# Patient Record
Sex: Female | Born: 1975
Health system: Southern US, Community
[De-identification: ages and names within clinical notes are randomized; demographics above are authoritative.]

## PROBLEM LIST (undated history)

## (undated) ENCOUNTER — Emergency Department (HOSPITAL_COMMUNITY): Discharge status: Absent without leave | Payer: Self-pay

## (undated) ENCOUNTER — Emergency Department (HOSPITAL_COMMUNITY): Admission: EM | Discharge status: Absent without leave | Payer: Self-pay

## (undated) DIAGNOSIS — T7840XA Allergy, unspecified, initial encounter: Secondary | ICD-10-CM

## (undated) DIAGNOSIS — E079 Disorder of thyroid, unspecified: Secondary | ICD-10-CM

## (undated) DIAGNOSIS — F419 Anxiety disorder, unspecified: Secondary | ICD-10-CM

## (undated) DIAGNOSIS — E559 Vitamin D deficiency, unspecified: Secondary | ICD-10-CM

## (undated) DIAGNOSIS — Z8616 Personal history of COVID-19: Secondary | ICD-10-CM

## (undated) DIAGNOSIS — G43909 Migraine, unspecified, not intractable, without status migrainosus: Secondary | ICD-10-CM

## (undated) DIAGNOSIS — N959 Unspecified menopausal and perimenopausal disorder: Secondary | ICD-10-CM

## (undated) DIAGNOSIS — E039 Hypothyroidism, unspecified: Secondary | ICD-10-CM

## (undated) HISTORY — DX: Allergy, unspecified, initial encounter: T78.40XA

## (undated) HISTORY — DX: Hypothyroidism, unspecified: E03.9

## (undated) HISTORY — DX: Disorder of thyroid, unspecified: E07.9

## (undated) HISTORY — DX: Migraine, unspecified, not intractable, without status migrainosus: G43.909

## (undated) HISTORY — DX: Personal history of COVID-19: Z86.16

## (undated) HISTORY — DX: Unspecified menopausal and perimenopausal disorder: N95.9

## (undated) HISTORY — DX: Anxiety disorder, unspecified: F41.9

## (undated) HISTORY — DX: Vitamin D deficiency, unspecified: E55.9

---

## 1998-11-27 ENCOUNTER — Inpatient Hospital Stay (HOSPITAL_COMMUNITY): Admission: AD | Admit: 1998-11-27 | Discharge: 1998-11-27 | Payer: Self-pay | Admitting: *Deleted

## 1999-01-31 ENCOUNTER — Inpatient Hospital Stay (HOSPITAL_COMMUNITY): Admission: AD | Admit: 1999-01-31 | Discharge: 1999-02-02 | Payer: Self-pay | Admitting: Obstetrics & Gynecology

## 1999-03-18 ENCOUNTER — Other Ambulatory Visit: Admission: RE | Admit: 1999-03-18 | Discharge: 1999-03-18 | Payer: Self-pay | Admitting: *Deleted

## 2000-04-15 ENCOUNTER — Other Ambulatory Visit: Admission: RE | Admit: 2000-04-15 | Discharge: 2000-04-15 | Payer: Self-pay | Admitting: *Deleted

## 2001-04-19 ENCOUNTER — Other Ambulatory Visit: Admission: RE | Admit: 2001-04-19 | Discharge: 2001-04-19 | Payer: Self-pay | Admitting: *Deleted

## 2002-01-03 ENCOUNTER — Inpatient Hospital Stay (HOSPITAL_COMMUNITY): Admission: AD | Admit: 2002-01-03 | Discharge: 2002-01-05 | Payer: Self-pay | Admitting: Obstetrics and Gynecology

## 2002-01-03 ENCOUNTER — Encounter (INDEPENDENT_AMBULATORY_CARE_PROVIDER_SITE_OTHER): Payer: Self-pay

## 2002-02-08 ENCOUNTER — Other Ambulatory Visit: Admission: RE | Admit: 2002-02-08 | Discharge: 2002-02-08 | Payer: Self-pay | Admitting: Obstetrics and Gynecology

## 2003-02-12 ENCOUNTER — Other Ambulatory Visit: Admission: RE | Admit: 2003-02-12 | Discharge: 2003-02-12 | Payer: Self-pay | Admitting: *Deleted

## 2003-02-26 ENCOUNTER — Encounter: Payer: Self-pay | Admitting: *Deleted

## 2003-02-26 ENCOUNTER — Encounter: Admission: RE | Admit: 2003-02-26 | Discharge: 2003-02-26 | Payer: Self-pay | Admitting: *Deleted

## 2004-07-27 ENCOUNTER — Emergency Department (HOSPITAL_COMMUNITY): Admission: EM | Admit: 2004-07-27 | Discharge: 2004-07-27 | Payer: Self-pay | Admitting: Emergency Medicine

## 2008-09-26 ENCOUNTER — Encounter: Admission: RE | Admit: 2008-09-26 | Discharge: 2008-09-26 | Payer: Self-pay | Admitting: Endocrinology

## 2011-06-26 ENCOUNTER — Other Ambulatory Visit: Payer: Self-pay | Admitting: Dermatology

## 2013-12-22 ENCOUNTER — Encounter: Payer: Self-pay | Admitting: Podiatrist

## 2013-12-22 ENCOUNTER — Ambulatory Visit (INDEPENDENT_AMBULATORY_CARE_PROVIDER_SITE_OTHER): Payer: 59 | Admitting: Podiatrist

## 2013-12-22 ENCOUNTER — Other Ambulatory Visit: Payer: Self-pay | Admitting: *Deleted

## 2013-12-22 VITALS — BP 119/78 | HR 88 | Resp 18

## 2013-12-22 DIAGNOSIS — B351 Tinea unguium: Secondary | ICD-10-CM

## 2013-12-22 NOTE — Progress Notes (Signed)
   Subjective:    Patient ID: Michele Dennis, female    DOB: 02-01-1976, 38 y.o.   MRN: [id number]  HPI my toenails are discolored and has been going on for about 5 years and pain in big toe on left and brittle and crumble sometimes    Review of Systems  Constitutional: Negative.   HENT: Negative.   Eyes: Negative.   Respiratory: Negative.   Cardiovascular: Negative.   Gastrointestinal: Negative.   Endocrine: Negative.   Genitourinary: Negative.   Musculoskeletal: Negative.   Skin: Negative.   Allergic/Immunologic: Negative.   Neurological: Negative.   Hematological: Negative.   Psychiatric/Behavioral: Negative.        Objective:   Physical Exam  GENERAL APPEARANCE: Alert, conversant. Appropriately groomed. No acute distress.  VASCULAR: Pedal pulses palpable and strong bilateral.  Capillary refill time is immediate to all digits,  Proximal to distal cooling it warm to warm.  Digital hair growth is present bilateral  NEUROLOGIC: sensation is intact epicritically and protectively to 5.07 monofilament at 5/5 sites bilateral.  Light touch is intact bilateral, vibratory sensation intact bilateral, achilles tendon reflex is intact bilateral.  MUSCULOSKELETAL: acceptable muscle strength, tone and stability bilateral.  Intrinsic muscluature intact bilateral.  Rectus appearance of foot and digits noted bilateral.   DERMATOLOGIC: Yellowish brownish discoloration present to bilateral great toenails. Subungual debris minimal, mild dystrophic changes are seen.    Assessment & Plan:  Onychomycosis versus dystrophy bilateral great toenails  Plan: Discussed oral, topical, and laser therapy for onychomycosis. At today's visit a culture was taken of the nail and we will wait for the results before deciding on treatment options. We did discuss oral therapy as a first line approach.

## 2013-12-22 NOTE — Patient Instructions (Signed)
We will call with the results of the culture to determine the best treatment option for your nails.  Onychomycosis/Fungal Toenails  WHAT IS IT? An infection that lies within the keratin of your nail plate that is caused by a fungus.  WHY ME? Fungal infections affect all ages, sexes, races, and creeds.  There may be many factors that predispose you to a fungal infection such as age, coexisting medical conditions such as diabetes, or an autoimmune disease; stress, medications, fatigue, genetics, etc.  Bottom line: fungus thrives in a warm, moist environment and your shoes offer such a location.  IS IT CONTAGIOUS? Theoretically, yes.  You do not want to share shoes, nail clippers or files with someone who has fungal toenails.  Walking around barefoot in the same room or sleeping in the same bed is unlikely to transfer the organism.  It is important to realize, however, that fungus can spread easily from one nail to the next on the same foot.  HOW DO WE TREAT THIS?  There are several ways to treat this condition.  Treatment may depend on many factors such as age, medications, pregnancy, liver and kidney conditions, etc.  It is best to ask your doctor which options are available to you.  1. No treatment.   Unlike many other medical concerns, you can live with this condition.  However for many people this can be a painful condition and may lead to ingrown toenails or a bacterial infection.  It is recommended that you keep the nails cut short to help reduce the amount of fungal nail. 2. Topical treatment.  These range from herbal remedies to prescription strength nail lacquers.  About 40-50% effective, topicals require twice daily application for approximately 9 to 12 months or until an entirely new nail has grown out.  The most effective topicals are medical grade medications available through physicians offices. 3. Oral antifungal medications.  With an 80-90% cure rate, the most common oral medication  requires 3 to 4 months of therapy and stays in your system for a year as the new nail grows out.  Oral antifungal medications do require blood work to make sure it is a safe drug for you.  A liver function panel will be performed prior to starting the medication and after the first month of treatment.  It is important to have the blood work performed to avoid any harmful side effects.  In general, this medication safe but blood work is required. 4. Laser Therapy.  This treatment is performed by applying a specialized laser to the affected nail plate.  This therapy is noninvasive, fast, and non-painful.  It is not covered by insurance and is therefore, out of pocket.  [facility] is the only practice in the area to offer this therapy.

## 2013-12-22 NOTE — Progress Notes (Deleted)
Bilateral great toenails-- possile finger..... Culture--  Will decide on treatment

## 2013-12-25 ENCOUNTER — Telehealth: Payer: Self-pay | Admitting: *Deleted

## 2013-12-25 NOTE — Telephone Encounter (Signed)
B/L 1st [phone], for determination of fungal elements.

## 2014-01-09 ENCOUNTER — Telehealth: Payer: Self-pay | Admitting: *Deleted

## 2014-01-09 NOTE — Telephone Encounter (Signed)
Pt asked for results of fungal cultures of 12/22/2013.  I informed pt the results would be back in 4 - 6 weeks.

## 2014-01-25 ENCOUNTER — Telehealth: Payer: Self-pay | Admitting: *Deleted

## 2014-01-25 MED ORDER — NUVAIL EX SOLN: 1.0000 [drp] | Freq: Every day | CUTANEOUS | Status: DC

## 2014-01-25 NOTE — Telephone Encounter (Signed)
Dr Irving ShowsEgerton states initial fungal results are negative, recommends NuVail Topical.  Orders to pt and she would like the prescription.  Sent electronically.

## 2014-02-02 ENCOUNTER — Encounter: Payer: Self-pay | Admitting: Podiatrist

## 2014-02-09 ENCOUNTER — Telehealth: Payer: Self-pay | Admitting: *Deleted

## 2014-02-09 NOTE — Telephone Encounter (Signed)
Dr Irving ShowsEgerton states pt's final culture result was negative.  I informed the pt and reminded her of the NuVail that was initially called in 01/09/2014.

## 2016-06-09 ENCOUNTER — Emergency Department (HOSPITAL_BASED_OUTPATIENT_CLINIC_OR_DEPARTMENT_OTHER)
Admission: EM | Admit: 2016-06-09 | Discharge: 2016-06-09 | Disposition: A | Discharge status: Home / Self Care | Payer: Self-pay | Attending: Emergency Medicine | Admitting: Emergency Medicine

## 2016-06-09 ENCOUNTER — Encounter (HOSPITAL_BASED_OUTPATIENT_CLINIC_OR_DEPARTMENT_OTHER): Payer: Self-pay

## 2016-06-09 DIAGNOSIS — L03031 Cellulitis of right toe: Secondary | ICD-10-CM

## 2016-06-09 DIAGNOSIS — L03115 Cellulitis of right lower limb: Secondary | ICD-10-CM | POA: Insufficient documentation

## 2016-06-09 MED ORDER — CEPHALEXIN 250 MG PO CAPS
500.0000 mg | ORAL_CAPSULE | Freq: Once | ORAL | Status: AC
  Administered 2016-06-09: 500 mg via ORAL

## 2016-06-09 MED ORDER — CEPHALEXIN 250 MG PO CAPS
ORAL_CAPSULE | ORAL | Status: AC
  Filled 2016-06-09: qty 2

## 2016-06-09 MED ORDER — CEPHALEXIN 500 MG PO CAPS: 500.0000 mg | ORAL_CAPSULE | Freq: Four times a day (QID) | ORAL | Status: DC

## 2016-06-09 NOTE — ED Notes (Signed)
MD at bedside. 

## 2016-06-09 NOTE — Discharge Instructions (Signed)
You were seen today for likely very early infection of your foot because of a recent wart removal. You are well appearing otherwise. Sometimes you can get in skin infection called Cellulitis. If you develop fever, worsening pain or redness you need to be reevaluated.  Cellulitis Cellulitis is an infection of the skin and the tissue beneath it. The infected area is usually red and tender. Cellulitis occurs most often in the arms and lower legs.  CAUSES  Cellulitis is caused by bacteria that enter the skin through cracks or cuts in the skin. The most common types of bacteria that cause cellulitis are staphylococci and streptococci. SIGNS AND SYMPTOMS   Redness and warmth.  Swelling.  Tenderness or pain.  Fever. DIAGNOSIS  Your health care provider can usually determine what is wrong based on a physical exam. Blood tests may also be done. TREATMENT  Treatment usually involves taking an antibiotic medicine. HOME CARE INSTRUCTIONS   Take your antibiotic medicine as directed by your health care provider. Finish the antibiotic even if you start to feel better.  Keep the infected arm or leg elevated to reduce swelling.  Apply a warm cloth to the affected area up to 4 times per day to relieve pain.  Take medicines only as directed by your health care provider.  Keep all follow-up visits as directed by your health care provider. SEEK MEDICAL CARE IF:   You notice red streaks coming from the infected area.  Your red area gets larger or turns dark in color.  Your bone or joint underneath the infected area becomes painful after the skin has healed.  Your infection returns in the same area or another area.  You notice a swollen bump in the infected area.  You develop new symptoms.  You have a fever. SEEK IMMEDIATE MEDICAL CARE IF:   You feel very sleepy.  You develop vomiting or diarrhea.  You have a general ill feeling (malaise) with muscle aches and pains.   This information  is not intended to replace advice given to you by your health care provider. Make sure you discuss any questions you have with your health care provider.   Document Released: 09/09/2005 Document Revised: 08/21/2015 Document Reviewed: 02/15/2012 Elsevier Interactive Patient Education [employer]2016 Elsevier Inc.

## 2016-06-09 NOTE — ED Provider Notes (Signed)
CSN: [id number]     Arrival date & time 06/09/16  2144 History  By signing my name below, I, [facility] Dennis, attest that this documentation has been prepared under the direction and in the presence of Shon Batonourtney F Emmelia Holdsworth, MD. Electronically Signed: Randell PatientMarrissa Dennis, ED Scribe. 06/09/2016. 11:26 PM.   Chief Complaint  Patient presents with  . Foot Pain    Patient is a 40 y.o. female presenting with lower extremity pain. The history is provided by the patient. No language interpreter was used.  Foot Pain   HPI Comments: Michele Dennis is a 40 y.o. female with no pertinent chronic conditions who presents to the Emergency Department complaining of constant, 5/10 right foot pain onset earlier this afternoon. Pt states that she was seen by her dermatologist earlier today to have a plantar wart removed where the MD caused her to bleed followed by red streaking in her foot this afternoon. Pain is worse with weight bearing and ambulation. She is allergic to sulfa antibiotics. Denies fevers or any other symptoms currently.  Past Medical History  Diagnosis Date  . Allergy   . Thyroid disease    History reviewed. No pertinent past surgical history. No family history on file. Social History  Substance Use Topics  . Smoking status: Never Smoker   . Smokeless tobacco: Never Used  . Alcohol Use: No   OB History    No data available     Review of Systems  Constitutional: Negative for fever.  Musculoskeletal: Positive for myalgias (right foot).  Skin: Positive for color change (erythema).  All other systems reviewed and are negative.     Allergies  Sulfa antibiotics  Home Medications   Prior to Admission medications   Medication Sig Start Date End Date Taking? Authorizing Provider  cephALEXin (KEFLEX) 500 MG capsule Take 1 capsule (500 mg total) by mouth 4 (four) times daily. 06/09/16   Shon Batonourtney F Lashaunda Schild, MD  loratadine-pseudoephedrine (CLARITIN-D 12-HOUR) 5-120 MG per  tablet Take 1 tablet by mouth 2 (two) times daily.    Historical Provider, MD  Multiple Vitamin (MULTIVITAMIN) tablet Take 1 tablet by mouth daily.    Historical Provider, MD  SYNTHROID 88 MCG tablet  12/19/13   Historical Provider, MD   BP 125/97 mmHg  Pulse 81  Temp(Src) 98.5 F (36.9 C) (Oral)  Resp 16  Ht 5\' 7"  (1.702 m)  Wt 130 lb (58.968 kg)  BMI 20.36 kg/m2  SpO2 100%  LMP 05/26/2016 Physical Exam  Constitutional: She is oriented to person, place, and time. She appears well-developed and well-nourished.  HENT:  Head: Normocephalic and atraumatic.  Cardiovascular: Normal rate, regular rhythm and normal heart sounds.   Pulmonary/Chest: Effort normal and breath sounds normal. No respiratory distress.  Neurological: She is alert and oriented to person, place, and time.  Skin: Skin is warm and dry.  Were removed over the plantar aspect of the foot just proximal to the fourth and fifth digits, no significant erythema around the wart removal site; however there is mild streaking over the dorsum of the foot that into the midfoot, no crepitus, tender to palpation  Psychiatric: She has a normal mood and affect.  Nursing note and vitals reviewed.   ED Course  Procedures   DIAGNOSTIC STUDIES: Oxygen Saturation is 100% on RA, normal by my interpretation.    COORDINATION OF CARE: 11:22 PM Will order and prescribe Keflex. Discussed treatment plan with pt at bedside and pt agreed to plan.   MDM  Final diagnoses:  Cellulitis of toe of right foot    Patient presents with concerns for infection from recent wart removal site. She is nontoxic. Afebrile. The site itself looks clean dry and intact without significant erythema but she does have some streaking up her foot. There is also a small abrasion at the wart removal site. Will place patient on Keflex for 5 days. Follow-up with dermatology.  After history, exam, and medical workup I feel the patient has been appropriately medically  screened and is safe for discharge home. Pertinent diagnoses were discussed with the patient. Patient was given return precautions.  I personally performed the services described in this documentation, which was scribed in my presence. The recorded information has been reviewed and is accurate.   Shon Batonourtney F Raelie Lohr, MD 06/10/16 [phone]

## 2016-06-09 NOTE — ED Notes (Signed)
Pt states she had wart removal from bottom of right foot at dermo today-now having increase pain, swelling red streaks to foot

## 2017-02-25 DIAGNOSIS — E039 Hypothyroidism, unspecified: Secondary | ICD-10-CM | POA: Diagnosis not present

## 2017-03-19 DIAGNOSIS — E039 Hypothyroidism, unspecified: Secondary | ICD-10-CM | POA: Diagnosis not present

## 2017-04-30 DIAGNOSIS — D225 Melanocytic nevi of trunk: Secondary | ICD-10-CM | POA: Diagnosis not present

## 2017-04-30 DIAGNOSIS — L700 Acne vulgaris: Secondary | ICD-10-CM | POA: Diagnosis not present

## 2017-04-30 DIAGNOSIS — L821 Other seborrheic keratosis: Secondary | ICD-10-CM | POA: Diagnosis not present

## 2017-09-16 DIAGNOSIS — R300 Dysuria: Secondary | ICD-10-CM | POA: Diagnosis not present

## 2017-10-01 DIAGNOSIS — N390 Urinary tract infection, site not specified: Secondary | ICD-10-CM | POA: Diagnosis not present

## 2017-10-01 DIAGNOSIS — R300 Dysuria: Secondary | ICD-10-CM | POA: Diagnosis not present

## 2017-10-25 DIAGNOSIS — Z1151 Encounter for screening for human papillomavirus (HPV): Secondary | ICD-10-CM | POA: Diagnosis not present

## 2017-10-25 DIAGNOSIS — Z1231 Encounter for screening mammogram for malignant neoplasm of breast: Secondary | ICD-10-CM | POA: Diagnosis not present

## 2017-10-25 DIAGNOSIS — Z01419 Encounter for gynecological examination (general) (routine) without abnormal findings: Secondary | ICD-10-CM | POA: Diagnosis not present

## 2017-10-25 DIAGNOSIS — Z6821 Body mass index (BMI) 21.0-21.9, adult: Secondary | ICD-10-CM | POA: Diagnosis not present

## 2018-02-05 ENCOUNTER — Emergency Department (HOSPITAL_COMMUNITY): Payer: BLUE CROSS/BLUE SHIELD

## 2018-02-05 ENCOUNTER — Other Ambulatory Visit: Payer: Self-pay

## 2018-02-05 ENCOUNTER — Emergency Department (HOSPITAL_COMMUNITY)
Admission: EM | Admit: 2018-02-05 | Discharge: 2018-02-05 | Disposition: A | Discharge status: Home / Self Care | Payer: BLUE CROSS/BLUE SHIELD | Attending: Emergency Medicine | Admitting: Emergency Medicine

## 2018-02-05 ENCOUNTER — Encounter (HOSPITAL_COMMUNITY): Payer: Self-pay

## 2018-02-05 DIAGNOSIS — W01198A Fall on same level from slipping, tripping and stumbling with subsequent striking against other object, initial encounter: Secondary | ICD-10-CM | POA: Insufficient documentation

## 2018-02-05 DIAGNOSIS — S0181XA Laceration without foreign body of other part of head, initial encounter: Secondary | ICD-10-CM | POA: Diagnosis not present

## 2018-02-05 DIAGNOSIS — R1012 Left upper quadrant pain: Secondary | ICD-10-CM | POA: Diagnosis not present

## 2018-02-05 DIAGNOSIS — R51 Headache: Secondary | ICD-10-CM | POA: Diagnosis not present

## 2018-02-05 DIAGNOSIS — Y9239 Other specified sports and athletic area as the place of occurrence of the external cause: Secondary | ICD-10-CM | POA: Insufficient documentation

## 2018-02-05 DIAGNOSIS — Y998 Other external cause status: Secondary | ICD-10-CM | POA: Insufficient documentation

## 2018-02-05 DIAGNOSIS — S3991XA Unspecified injury of abdomen, initial encounter: Secondary | ICD-10-CM | POA: Diagnosis not present

## 2018-02-05 DIAGNOSIS — R55 Syncope and collapse: Secondary | ICD-10-CM | POA: Diagnosis not present

## 2018-02-05 DIAGNOSIS — S2242XA Multiple fractures of ribs, left side, initial encounter for closed fracture: Secondary | ICD-10-CM | POA: Insufficient documentation

## 2018-02-05 DIAGNOSIS — R079 Chest pain, unspecified: Secondary | ICD-10-CM | POA: Diagnosis not present

## 2018-02-05 DIAGNOSIS — S0990XA Unspecified injury of head, initial encounter: Secondary | ICD-10-CM | POA: Diagnosis not present

## 2018-02-05 DIAGNOSIS — Y9343 Activity, gymnastics: Secondary | ICD-10-CM | POA: Diagnosis not present

## 2018-02-05 DIAGNOSIS — Z79899 Other long term (current) drug therapy: Secondary | ICD-10-CM | POA: Insufficient documentation

## 2018-02-05 DIAGNOSIS — S2232XA Fracture of one rib, left side, initial encounter for closed fracture: Secondary | ICD-10-CM | POA: Diagnosis not present

## 2018-02-05 DIAGNOSIS — W010XXA Fall on same level from slipping, tripping and stumbling without subsequent striking against object, initial encounter: Secondary | ICD-10-CM

## 2018-02-05 LAB — COMPREHENSIVE METABOLIC PANEL
ALK PHOS: 59 U/L (ref 38–126)
ALT: 28 U/L (ref 14–54)
ANION GAP: 12 (ref 5–15)
AST: 34 U/L (ref 15–41)
Albumin: 3.5 g/dL (ref 3.5–5.0)
BILIRUBIN TOTAL: 1 mg/dL (ref 0.3–1.2)
BUN: 14 mg/dL (ref 6–20)
CO2: 17 mmol/L — AB (ref 22–32)
CREATININE: 1.04 mg/dL — AB (ref 0.44–1.00)
Calcium: 8.7 mg/dL — ABNORMAL LOW (ref 8.9–10.3)
Chloride: 105 mmol/L (ref 101–111)
GFR calc non Af Amer: >60 mL/min (ref >60)
Glucose, Bld: 111 mg/dL — ABNORMAL HIGH (ref 65–99)
Potassium: 3.5 mmol/L (ref 3.5–5.1)
Sodium: 134 mmol/L — ABNORMAL LOW (ref 135–145)
Total Protein: 5.7 g/dL — ABNORMAL LOW (ref 6.5–8.1)

## 2018-02-05 LAB — CBC
HEMATOCRIT: 39.9 % (ref 36.0–46.0)
Hemoglobin: 13.5 g/dL (ref 12.0–15.0)
MCH: 36 pg — AB (ref 26.0–34.0)
MCHC: 33.8 g/dL (ref 30.0–36.0)
MCV: 106.4 fL — ABNORMAL HIGH (ref 78.0–100.0)
PLATELETS: 299 10*3/uL (ref 150–400)
RBC: 3.75 MIL/uL — ABNORMAL LOW (ref 3.87–5.11)
RDW: 11.8 % (ref 11.5–15.5)
WBC: 7.3 10*3/uL (ref 4.0–10.5)

## 2018-02-05 LAB — SAMPLE TO BLOOD BANK

## 2018-02-05 LAB — I-STAT BETA HCG BLOOD, ED (MC, WL, AP ONLY)

## 2018-02-05 MED ORDER — HYDROMORPHONE HCL 1 MG/ML IJ SOLN
0.5000 mg | Freq: Once | INTRAMUSCULAR | Status: DC
  Filled 2018-02-05: qty 1

## 2018-02-05 MED ORDER — ONDANSETRON HCL 4 MG/2ML IJ SOLN: 4.0000 mg | Freq: Once | INTRAMUSCULAR | Status: DC

## 2018-02-05 MED ORDER — LIDOCAINE-EPINEPHRINE (PF) 2 %-1:200000 IJ SOLN
20.0000 mL | Freq: Once | INTRAMUSCULAR | Status: AC
  Administered 2018-02-05: 20 mL
  Filled 2018-02-05: qty 20

## 2018-02-05 MED ORDER — TETANUS-DIPHTH-ACELL PERTUSSIS 5-2.5-18.5 LF-MCG/0.5 IM SUSP
0.5000 mL | Freq: Once | INTRAMUSCULAR | Status: DC
  Filled 2018-02-05: qty 0.5

## 2018-02-05 MED ORDER — ONDANSETRON 4 MG PO TBDP
4.0000 mg | ORAL_TABLET | Freq: Once | ORAL | Status: AC
  Administered 2018-02-05: 4 mg via ORAL
  Filled 2018-02-05: qty 1

## 2018-02-05 MED ORDER — ONDANSETRON 8 MG PO TBDP: 8.0000 mg | ORAL_TABLET | Freq: Three times a day (TID) | ORAL | 0 refills | Status: DC | PRN

## 2018-02-05 MED ORDER — HYDROCODONE-ACETAMINOPHEN 5-325 MG PO TABS: 1.0000 | ORAL_TABLET | Freq: Four times a day (QID) | ORAL | 0 refills | Status: DC | PRN

## 2018-02-05 MED ORDER — IOPAMIDOL (ISOVUE-300) INJECTION 61%
INTRAVENOUS | Status: AC
  Administered 2018-02-05: 100 mL
  Filled 2018-02-05: qty 100

## 2018-02-05 NOTE — ED Provider Notes (Signed)
[facility] EMERGENCY DEPARTMENT Provider Note   CSN: [id number] Arrival date & time: 02/05/18  1008     History   Chief Complaint Chief Complaint  Patient presents with  . Loss of Consciousness    HPI Michele Dennis is a 42 y.o. female.  Patient arrives via EMS s/p fall and syncopal event at gym. Had tripped over low hurdle, hit head and left lower ribs/upper abdomen. Was having pain in those areas.  Sat down to recover, when had syncopal event, hitting head, and sustaining forehead laceration. +loc. Pt states has history of 'passing out easily'. Denies palpitations or dysrhythmia. Dull headache. No neck pain. No chest pain other than left lower ribs where hit chest. No sob. Left upper abd pain. No vomiting. Denies back pain or extremity pain or injury.    The history is provided by the patient and the EMS personnel.  Loss of Consciousness   Associated symptoms include chest pain. Pertinent negatives include abdominal pain, back pain, confusion, fever, palpitations, vomiting and weakness.    Past Medical History:  Diagnosis Date  . Allergy   . Thyroid disease     There are no active problems to display for this patient.   History reviewed. No pertinent surgical history.  OB History    No data available       Home Medications    Prior to Admission medications   Medication Sig Start Date End Date Taking? Authorizing Provider  cephALEXin (KEFLEX) 500 MG capsule Take 1 capsule (500 mg total) by mouth 4 (four) times daily. 06/09/16   Horton, Mayer Masker, MD  loratadine-pseudoephedrine (CLARITIN-D 12-HOUR) 5-120 MG per tablet Take 1 tablet by mouth 2 (two) times daily.    [provider]  Multiple Vitamin (MULTIVITAMIN) tablet Take 1 tablet by mouth daily.    [provider]  SYNTHROID 25 MCG tablet  12/19/13   [provider]    Family History History reviewed. No pertinent family history.  Social History Social  History   Tobacco Use  . Smoking status: Never Smoker  . Smokeless tobacco: Never Used  Substance Use Topics  . Alcohol use: No  . Drug use: No     Allergies   Sulfa antibiotics   Review of Systems Review of Systems  Constitutional: Negative for fever.  HENT: Negative for nosebleeds.   Eyes: Negative for visual disturbance.  Respiratory: Negative for shortness of breath.   Cardiovascular: Positive for chest pain and syncope. Negative for palpitations.  Gastrointestinal: Negative for abdominal pain and vomiting.  Genitourinary: Negative for flank pain.  Musculoskeletal: Negative for back pain and neck pain.  Skin: Positive for wound.  Neurological: Negative for weakness and numbness.  Hematological: Does not bruise/bleed easily.  Psychiatric/Behavioral: Negative for confusion.     Physical Exam Updated Vital Signs BP 90/60 (BP Location: Right Arm)   Pulse 63   Ht 1.676 m (5\' 6" )   Wt 57.6 kg (127 lb)   LMP 01/22/2018 (Within Weeks)   SpO2 100%   BMI 20.50 kg/m   Physical Exam  Constitutional: She is oriented to person, place, and time. She appears well-developed and well-nourished. No distress.  HENT:  3 cm right forehead laceration.   Eyes: Conjunctivae and EOM are normal. Pupils are equal, round, and reactive to light. No scleral icterus.  Neck: Normal range of motion. Neck supple. No tracheal deviation present.  Cardiovascular: Normal rate, regular rhythm, normal heart sounds and intact distal pulses. Exam reveals no  gallop and no friction rub.  No murmur heard. Pulmonary/Chest: Effort normal and breath sounds normal. No respiratory distress. She exhibits tenderness.  Left lower chest wall tenderness. Normal chest wall movement. No crepitus.   Abdominal: Soft. Normal appearance. She exhibits no distension. There is tenderness.  Left upper abd tenderness.   Genitourinary:  Genitourinary Comments: No cva tenderness  Musculoskeletal: She exhibits no edema.    Neurological: She is alert and oriented to person, place, and time.  Speech clear/fluent. Motor intact bil, moves bil extremities purposefully w good strength.   Skin: Skin is warm and dry. No rash noted. She is not diaphoretic.  Psychiatric: She has a normal mood and affect.  Nursing note and vitals reviewed.    ED Treatments / Results  Labs (all labs ordered are listed, but only abnormal results are displayed) Results for orders placed or performed during the hospital encounter of 02/05/18  Comprehensive metabolic panel  Result Value Ref Range   Sodium 134 (L) 135 - 145 mmol/L   Potassium 3.5 3.5 - 5.1 mmol/L   Chloride 105 101 - 111 mmol/L   CO2 17 (L) 22 - 32 mmol/L   Glucose, Bld 111 (H) 65 - 99 mg/dL   BUN 14 6 - 20 mg/dL   Creatinine, Ser 1.61 (H) 0.44 - 1.00 mg/dL   Calcium 8.7 (L) 8.9 - 10.3 mg/dL   Total Protein 5.7 (L) 6.5 - 8.1 g/dL   Albumin 3.5 3.5 - 5.0 g/dL   AST 34 15 - 41 U/L   ALT 28 14 - 54 U/L   Alkaline Phosphatase 59 38 - 126 U/L   Total Bilirubin 1.0 0.3 - 1.2 mg/dL   GFR calc non Af Amer >60 >60 mL/min   GFR calc Af Amer >60 >60 mL/min   Anion gap 12 5 - 15  CBC  Result Value Ref Range   WBC 7.3 4.0 - 10.5 K/uL   RBC 3.75 (L) 3.87 - 5.11 MIL/uL   Hemoglobin 13.5 12.0 - 15.0 g/dL   HCT 09.6 04.5 - 40.9 %   MCV 106.4 (H) 78.0 - 100.0 fL   MCH 36.0 (H) 26.0 - 34.0 pg   MCHC 33.8 30.0 - 36.0 g/dL   RDW 81.1 91.4 - 78.2 %   Platelets 299 150 - 400 K/uL  I-Stat Beta hCG blood, ED ([facility], [facility], [facility] only)  Result Value Ref Range   I-stat hCG, quantitative <5.0 <5 mIU/mL   Comment 3          Sample to Blood Bank  Result Value Ref Range   Blood Bank Specimen SAMPLE AVAILABLE FOR TESTING    Sample Expiration      02/06/2018 Performed at [facility], [address] [place], [place] [address]     EKG  EKG Interpretation None       Radiology Dg Ribs Unilateral W/chest Left  Result Date: 02/05/2018 CLINICAL DATA:  Left-sided chest pain  after fall. EXAM: LEFT RIBS AND CHEST - 3+ VIEW COMPARISON:  None. FINDINGS: Minimally displaced fractures of the left anterior ninth and tenth ribs. There is no evidence of pneumothorax or pleural effusion. Both lungs are clear. Heart size and mediastinal contours are within normal limits. IMPRESSION: 1. Minimally displaced fractures of the left anterior ninth and tenth ribs. 2.  No active cardiopulmonary disease. Electronically Signed   By: Obie Dredge M.D.   On: 02/05/2018 11:03   Ct Head Wo Contrast  Result Date: 02/05/2018 CLINICAL DATA:  Pain after trauma EXAM: CT HEAD WITHOUT CONTRAST TECHNIQUE: Contiguous axial images were obtained from the base of the skull through the vertex without intravenous contrast. COMPARISON:  None. FINDINGS: Brain: No evidence of acute infarction, hemorrhage, hydrocephalus, extra-axial collection or mass lesion/mass effect. Vascular: No hyperdense vessel or unexpected calcification. Skull: Normal. Negative for fracture or focal lesion. Sinuses/Orbits: No acute finding. Other: None. IMPRESSION: Normal study. Electronically Signed   By: Gerome Samavid  Williams III M.D   On: 02/05/2018 13:04   Ct Abdomen Pelvis W Contrast  Result Date: 02/05/2018 CLINICAL DATA:  pt fell on side them stood up and passed out and hit her head on a desk now has a 2in lac to side of head EXAM: CT ABDOMEN AND PELVIS WITH CONTRAST TECHNIQUE: Multidetector CT imaging of the abdomen and pelvis was performed using the standard protocol following bolus administration of intravenous contrast. CONTRAST:  100mL ISOVUE-300 IOPAMIDOL (ISOVUE-300) INJECTION 61% COMPARISON:  None. FINDINGS: Lower chest: Clear lung bases.  Heart normal in size. Hepatobiliary: No focal liver abnormality is seen. No gallstones, gallbladder wall thickening, or biliary dilatation. Pancreas: Unremarkable. No pancreatic ductal dilatation or surrounding inflammatory changes. Spleen: Normal in size without focal abnormality.  Adrenals/Urinary Tract: No adrenal masses. Left kidney is ptotic and under rotated, but normal in size. Right kidneys normal size, orientation and position. No renal masses or stones. Normal renal enhancement and excretion. No hydronephrosis. Ureters are normal in caliber. Bladder is unremarkable. Stomach/Bowel: Stomach and small bowel unremarkable. Mild to moderate increased stool in the colon. No colonic distention, wall thickening or evidence of inflammation. Appendix not discretely seen. No evidence of appendicitis. Vascular/Lymphatic: No significant vascular findings are present. No enlarged abdominal or pelvic lymph nodes. Reproductive: Uterus is normal in overall size. There is heterogeneous enhancement of the mid to upper uterine segment and fundus. The uterus has a septate morphology. The heterogeneous enhancement may be from poorly defined fibroids. No adnexal masses. Other: No abdominal wall hernia or abnormality. No abdominopelvic ascites. Musculoskeletal: No fracture or acute finding. No osteoblastic or osteolytic lesions. IMPRESSION: 1. No acute abnormalities within the abdomen or pelvis. 2. Abnormal appearing heterogeneous enhancement of the uterus. This is of unclear etiology. Consider further evaluation with transabdominal and endovaginal pelvic ultrasound. Uterus has a septate morphology on CT. 3. Mild to moderate increased stool in the colon. Electronically Signed   By: Amie Portlandavid  Ormond M.D.   On: 02/05/2018 13:08    Procedures Procedures (including critical care time)  Medications Ordered in ED Medications - No data to display   Initial Impression / Assessment and Plan / ED Course  I have reviewed the triage vital signs and the nursing notes.  Pertinent labs & imaging results that were available during my care of the patient were reviewed by me and considered in my medical decision making (see chart for details).  Iv ns.   Stat labs and imaging.  Pt indicates bp normally runs now.    Recheck pt breathing comfortably. Pulse ox 100%.  Dilaudid .5 mg iv for pain. zofran for nausea.  Reviewed cxr - discussed results w pt.   abd ct pending.  Abd ct neg for acute trauma.   Labs reviewed - k normal.   Recheck abd soft, no mid to lower abd or pelvic tenderness.  Tetanus unknown. Tetanus im.     Final Clinical Impressions(s) / ED Diagnoses   Final diagnoses:  None    ED Discharge Orders    None  Cathren Laine, MD 02/05/18 [phone]

## 2018-02-05 NOTE — Progress Notes (Signed)
Called to trauma C for patient who fell in gym.  Chaplain not needed.   02/05/18 1000  Clinical Encounter Type  Visited With Other (Comment) (Paged to [facility] C)  Visit Type Trauma  Referral From Nurse  Consult/Referral To Chaplain  Phebe Collaonna S Rashidah [place], Chaplain

## 2018-02-05 NOTE — Discharge Instructions (Addendum)
It was our pleasure to provide your ER care today - we hope that you feel better.  Keep wound/laceration very clean. You may apply a thin coat of bacitracin to area for the next couple days. Have sutures removed, your doctor or urgent care, in 5-7 days.   For rib fractures, take motrin or aleve as need for pain. You may also take hydrocodone as need for pain. No driving for the next 6 hours or when taking hydrocodone. Also, do not take tylenol or acetaminophen containing medication when taking hydrocodone.  In future, if you begin to feel weak or faint as if you may pass out, lie down/flat immediately, so as to avoid passing out and possible injury.   Your CT scan was read as showing: IMPRESSION: 1. No acute abnormalities within the abdomen or pelvis. 2. Abnormal appearing heterogeneous enhancement of the uterus. This is of unclear etiology. Consider further evaluation with transabdominal and endovaginal pelvic ultrasound. Uterus has a septate morphology on CT.  As relates the reading above, follow up with your doctor/gynecologist in the next 1-2 weeks - have them review the ct scan and discuss possible pelvic ultrasound then.   Return to ER if worse, new symptoms, new or severe pain, increased trouble breathing, other concern.

## 2018-02-05 NOTE — ED Notes (Signed)
Patient transported to CT 

## 2018-02-05 NOTE — ED Notes (Addendum)
Patient and husband at bedside requesting plastic surgeon fix her laceration on her forehead. MD notified - at bedside.

## 2018-02-05 NOTE — ED Triage Notes (Signed)
Pt from [facility] post fall by [facility] pt fell on side them stood up and passed out and hit her head on a desk now has a 2in lac to side of head. Pt is A&O x4 ambulatory with EMS

## 2018-02-10 DIAGNOSIS — S2239XA Fracture of one rib, unspecified side, initial encounter for closed fracture: Secondary | ICD-10-CM | POA: Diagnosis not present

## 2018-02-10 DIAGNOSIS — S060X9A Concussion with loss of consciousness of unspecified duration, initial encounter: Secondary | ICD-10-CM | POA: Diagnosis not present

## 2018-02-10 DIAGNOSIS — E039 Hypothyroidism, unspecified: Secondary | ICD-10-CM | POA: Diagnosis not present

## 2018-02-10 DIAGNOSIS — S0181XA Laceration without foreign body of other part of head, initial encounter: Secondary | ICD-10-CM | POA: Diagnosis not present

## 2018-02-11 ENCOUNTER — Other Ambulatory Visit: Payer: Self-pay | Admitting: Obstetrics & Gynecology

## 2018-02-11 DIAGNOSIS — N926 Irregular menstruation, unspecified: Secondary | ICD-10-CM | POA: Diagnosis not present

## 2018-02-11 DIAGNOSIS — N920 Excessive and frequent menstruation with regular cycle: Secondary | ICD-10-CM | POA: Diagnosis not present

## 2018-02-25 DIAGNOSIS — N920 Excessive and frequent menstruation with regular cycle: Secondary | ICD-10-CM | POA: Diagnosis not present

## 2018-03-11 DIAGNOSIS — E039 Hypothyroidism, unspecified: Secondary | ICD-10-CM | POA: Diagnosis not present

## 2018-04-04 DIAGNOSIS — E039 Hypothyroidism, unspecified: Secondary | ICD-10-CM | POA: Diagnosis not present

## 2018-05-17 DIAGNOSIS — L700 Acne vulgaris: Secondary | ICD-10-CM | POA: Diagnosis not present

## 2018-07-14 DIAGNOSIS — R300 Dysuria: Secondary | ICD-10-CM | POA: Diagnosis not present

## 2018-07-14 DIAGNOSIS — N390 Urinary tract infection, site not specified: Secondary | ICD-10-CM | POA: Diagnosis not present

## 2018-07-21 DIAGNOSIS — N390 Urinary tract infection, site not specified: Secondary | ICD-10-CM | POA: Diagnosis not present

## 2019-04-04 DIAGNOSIS — E039 Hypothyroidism, unspecified: Secondary | ICD-10-CM | POA: Diagnosis not present

## 2019-04-10 DIAGNOSIS — E039 Hypothyroidism, unspecified: Secondary | ICD-10-CM | POA: Diagnosis not present

## 2019-07-11 DIAGNOSIS — L570 Actinic keratosis: Secondary | ICD-10-CM | POA: Diagnosis not present

## 2019-07-11 DIAGNOSIS — L821 Other seborrheic keratosis: Secondary | ICD-10-CM | POA: Diagnosis not present

## 2019-07-11 DIAGNOSIS — D235 Other benign neoplasm of skin of trunk: Secondary | ICD-10-CM | POA: Diagnosis not present

## 2019-07-20 DIAGNOSIS — Z6822 Body mass index (BMI) 22.0-22.9, adult: Secondary | ICD-10-CM | POA: Diagnosis not present

## 2019-07-20 DIAGNOSIS — Z1231 Encounter for screening mammogram for malignant neoplasm of breast: Secondary | ICD-10-CM | POA: Diagnosis not present

## 2019-07-20 DIAGNOSIS — Z01419 Encounter for gynecological examination (general) (routine) without abnormal findings: Secondary | ICD-10-CM | POA: Diagnosis not present

## 2019-09-25 DIAGNOSIS — Z0000 Encounter for general adult medical examination without abnormal findings: Secondary | ICD-10-CM | POA: Diagnosis not present

## 2019-10-20 IMAGING — CT CT HEAD W/O CM
4 series · 17 of 47 positions shown, 19 images · non-contrast
Comparison: None.

CLINICAL DATA: Pain after trauma

EXAM:
CT HEAD WITHOUT CONTRAST
TECHNIQUE: Contiguous axial images were obtained from the base of the skull
through the vertex without intravenous contrast.

[Series 3: head without · axial · non-contrast · 0.39mm/px · z∈[+1224,+1334]mm · 7 of 30 slices shown, 9 images]
[im 4/30  brain]
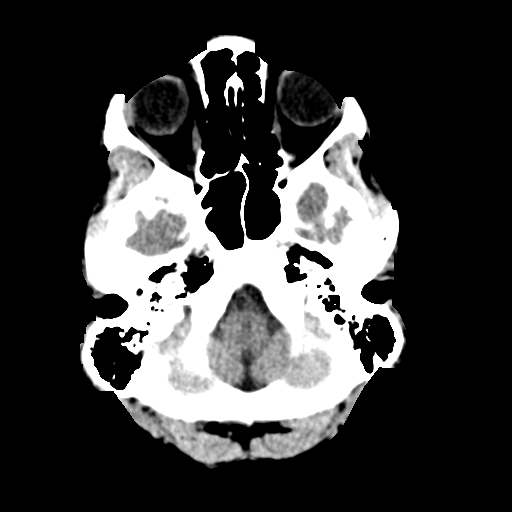
[im 4/30  bone]
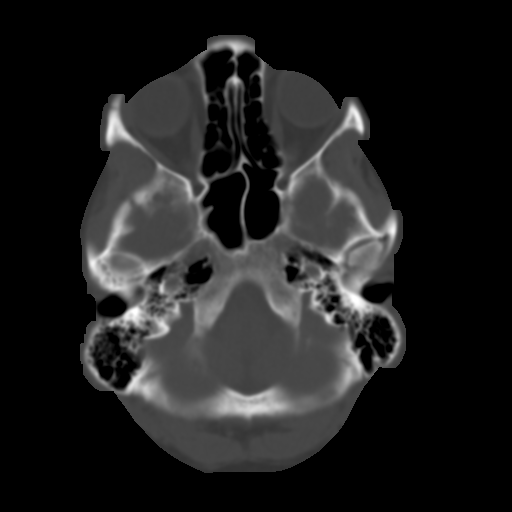
[im 8/30  brain]
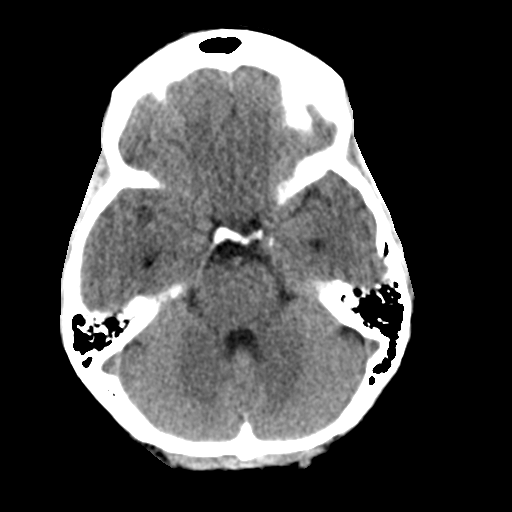
[im 11/30  brain]
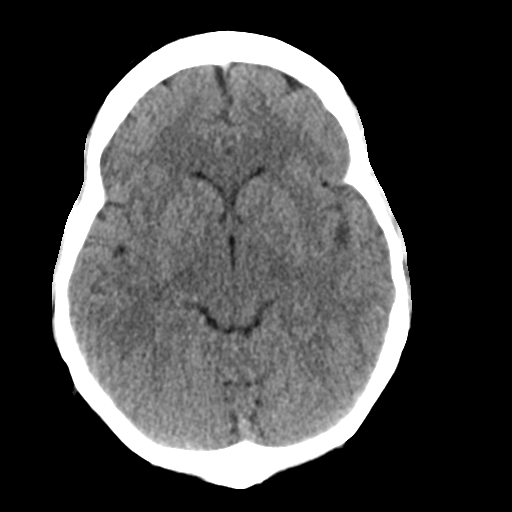
[im 15/30  brain]
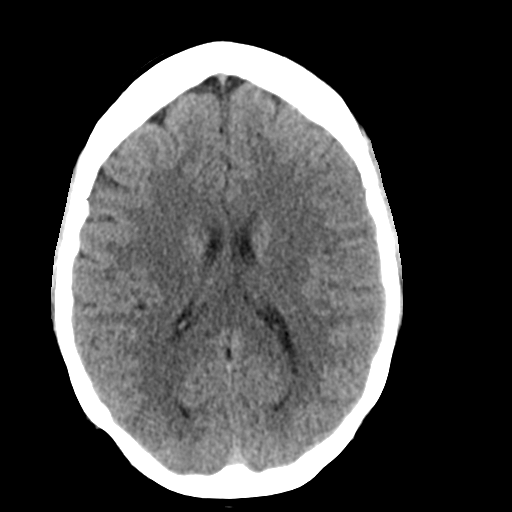
[im 19/30  brain]
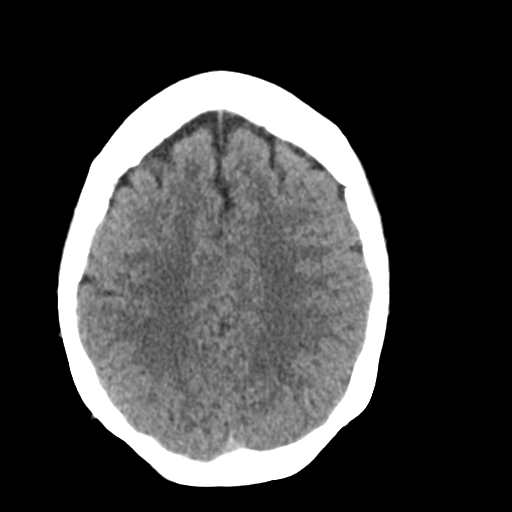
[im 19/30  bone]
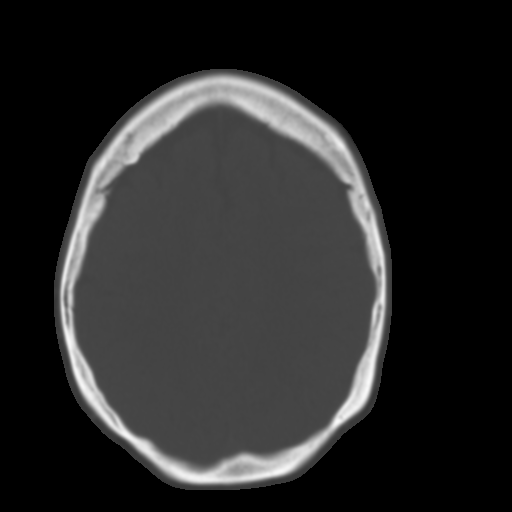
[im 22/30  brain]
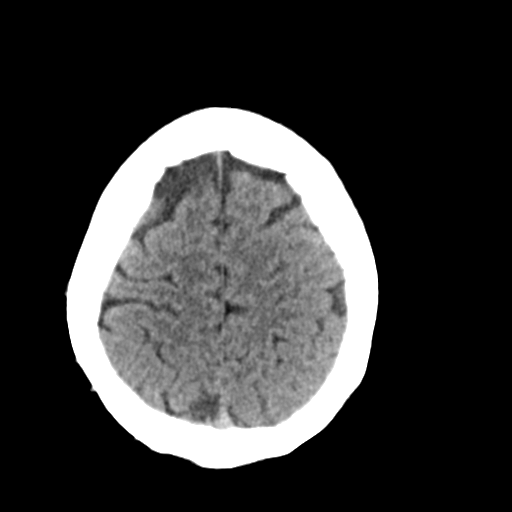
[im 26/30  brain]
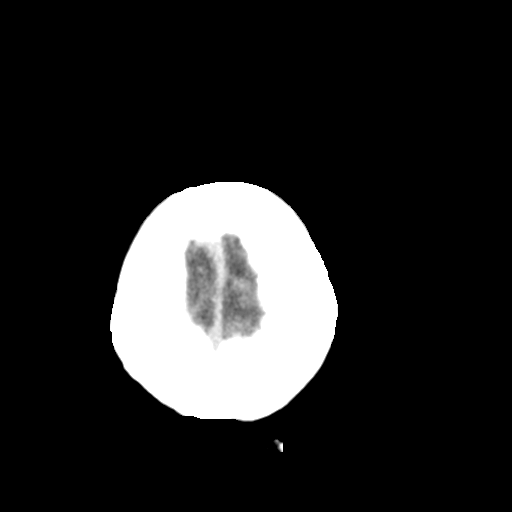

[Series 4: head bone · axial · 0.39mm/px · z∈[+1223,+1273]mm · 4 of 73 slices shown]
[im 8/73  bone]
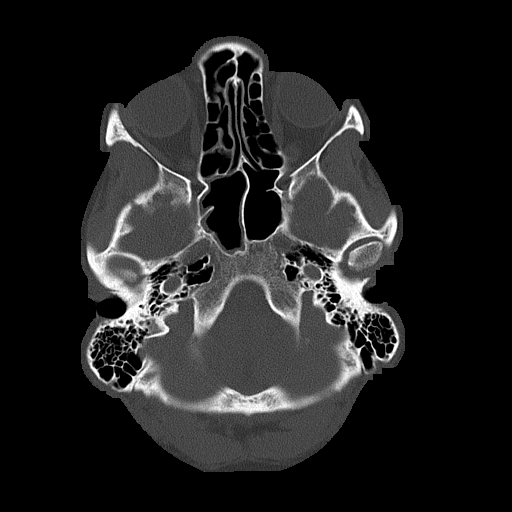
[im 15/73  bone]
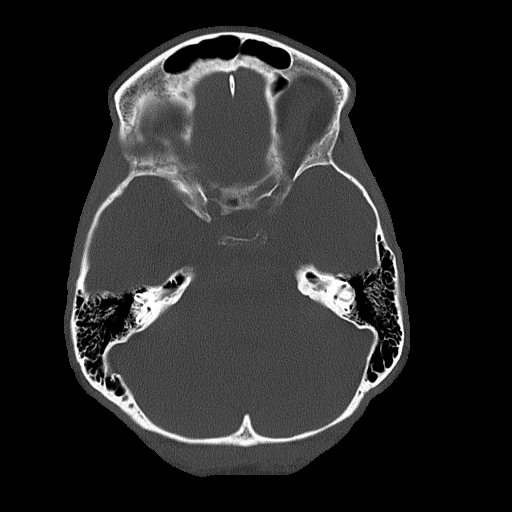
[im 22/73  bone]
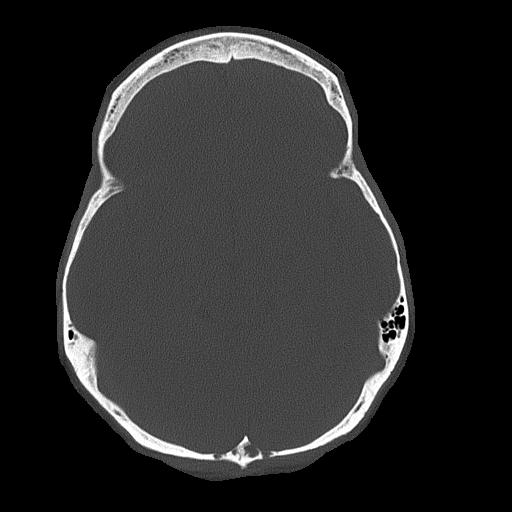
[im 33/73  bone]
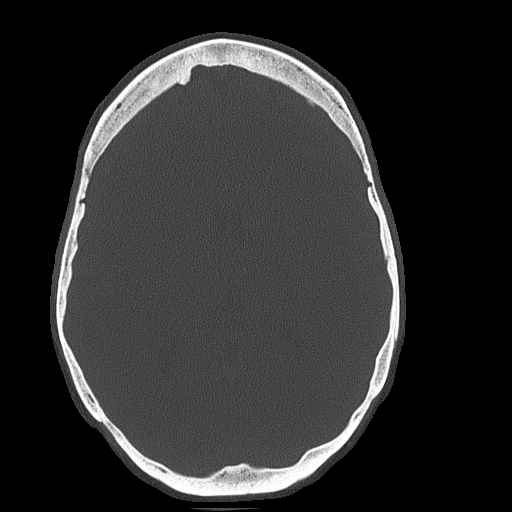

[Series 5: head without cor · coronal · non-contrast · 0.30mm/px · 3 of 67 slices shown]
[im 23/67  brain]
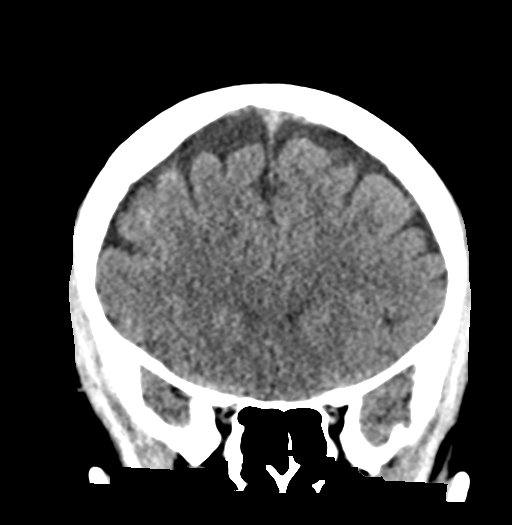
[im 30/67  brain]
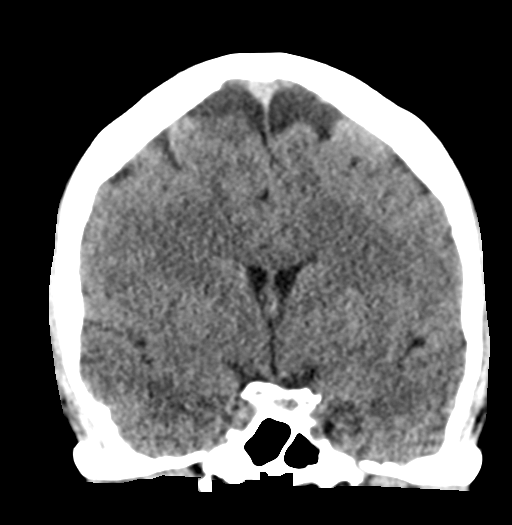
[im 37/67  brain]
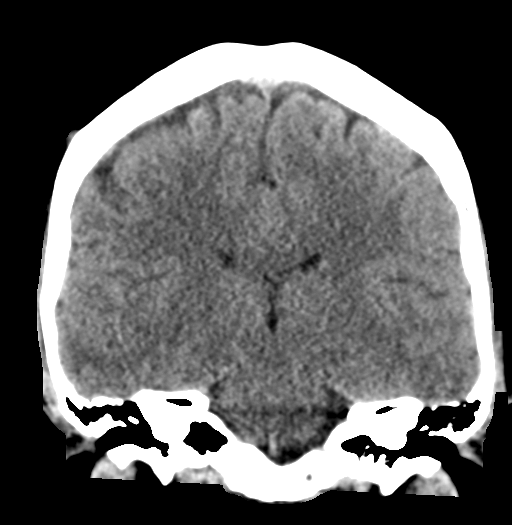

[Series 6: head without sag · sagittal · non-contrast · 0.31mm/px · 3 of 63 slices shown]
[im 21/63  brain]
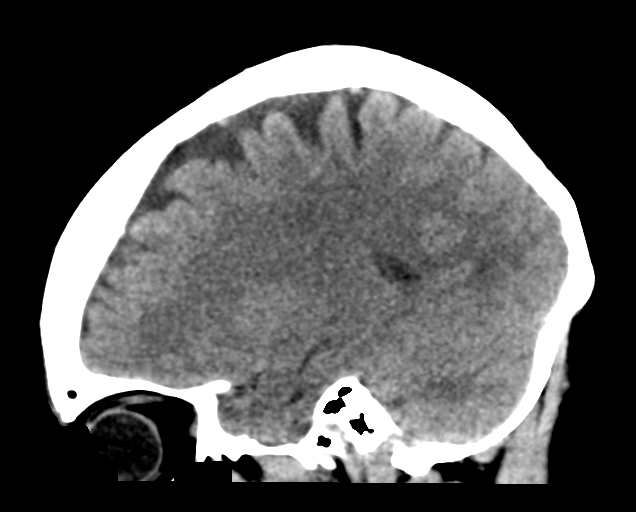
[im 32/63  brain]
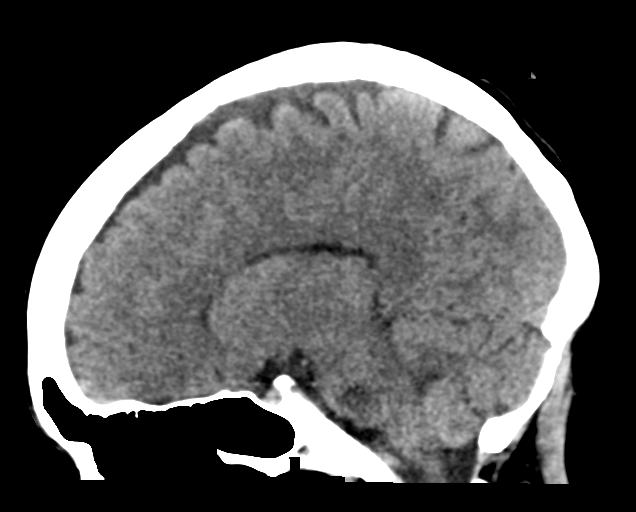
[im 42/63  brain]
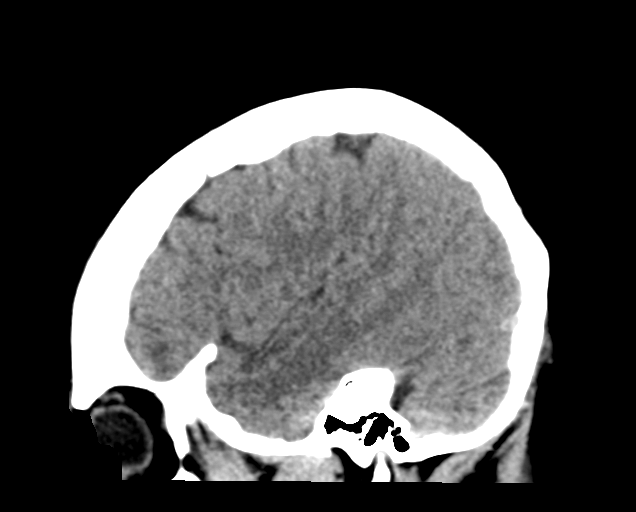

[17 of 47 positions shown; findings below may reference images not displayed]

FINDINGS: Brain: No evidence of acute infarction, hemorrhage, hydrocephalus,
extra-axial collection or mass lesion/mass effect.

Vascular: No hyperdense vessel or unexpected calcification.

Skull: Normal. Negative for fracture or focal lesion.

Sinuses/Orbits: No acute finding.

Other: None.
IMPRESSION: Normal study.

## 2020-02-29 ENCOUNTER — Ambulatory Visit: Payer: Self-pay | Attending: Internal Medicine

## 2020-02-29 DIAGNOSIS — Z23 Encounter for immunization: Secondary | ICD-10-CM

## 2020-02-29 NOTE — Progress Notes (Signed)
   Covid-19 Vaccination Clinic  Name:  Michele Dennis    MRN: [id number] DOB: 1976/08/17  02/29/2020  Michele Dennis was observed post Covid-19 immunization for 15 minutes without incident. She was provided with Vaccine Information Sheet and instruction to access the V-Safe system.   Michele Dennis was instructed to call 911 with any severe reactions post vaccine: Marland Kitchen Difficulty breathing  . Swelling of face and throat  . A fast heartbeat  . A bad rash all over body  . Dizziness and weakness   Immunizations Administered    Name Date Dose VIS Date Route   Pfizer COVID-19 Vaccine 02/29/2020 10:35 AM 0.3 mL 11/24/2019 Intramuscular   Manufacturer: [employer], [employer]   Lot: [id number]   NDC: [address]-7

## 2020-03-25 ENCOUNTER — Ambulatory Visit: Payer: Self-pay | Attending: Internal Medicine

## 2020-03-25 DIAGNOSIS — Z23 Encounter for immunization: Secondary | ICD-10-CM

## 2020-03-25 NOTE — Progress Notes (Signed)
   Covid-19 Vaccination Clinic  Name:  FRANKI ALCAIDE    MRN: [id number] DOB: 1976-01-24  03/25/2020  Ms. Cuddeback was observed post Covid-19 immunization for 15 minutes without incident. She was provided with Vaccine Information Sheet and instruction to access the V-Safe system.   Ms. Eileen Stanford was instructed to call 911 with any severe reactions post vaccine: Marland Kitchen Difficulty breathing  . Swelling of face and throat  . A fast heartbeat  . A bad rash all over body  . Dizziness and weakness   Immunizations Administered    Name Date Dose VIS Date Route   Pfizer COVID-19 Vaccine 03/25/2020  9:37 AM 0.3 mL 11/24/2019 Intramuscular   Manufacturer: [employer], [employer]   Lot: [id number]   NDC: [address]-4

## 2020-10-29 ENCOUNTER — Other Ambulatory Visit: Payer: Self-pay

## 2020-10-29 ENCOUNTER — Encounter (HOSPITAL_BASED_OUTPATIENT_CLINIC_OR_DEPARTMENT_OTHER): Payer: Self-pay | Admitting: Emergency Medicine

## 2020-10-29 ENCOUNTER — Emergency Department (HOSPITAL_BASED_OUTPATIENT_CLINIC_OR_DEPARTMENT_OTHER)
Admission: EM | Admit: 2020-10-29 | Discharge: 2020-10-29 | Disposition: A | Discharge status: Home / Self Care | Payer: Self-pay | Attending: Emergency Medicine | Admitting: Emergency Medicine

## 2020-10-29 DIAGNOSIS — R42 Dizziness and giddiness: Secondary | ICD-10-CM | POA: Insufficient documentation

## 2020-10-29 DIAGNOSIS — R002 Palpitations: Secondary | ICD-10-CM | POA: Insufficient documentation

## 2020-10-29 LAB — CBC
HCT: 41.7 % (ref 36.0–46.0)
Hemoglobin: 14.5 g/dL (ref 12.0–15.0)
MCH: 35.5 pg — ABNORMAL HIGH (ref 26.0–34.0)
MCHC: 34.8 g/dL (ref 30.0–36.0)
MCV: 102.2 fL — ABNORMAL HIGH (ref 80.0–100.0)
Platelets: 321 10*3/uL (ref 150–400)
RBC: 4.08 MIL/uL (ref 3.87–5.11)
RDW: 11.4 % — ABNORMAL LOW (ref 11.5–15.5)
WBC: 10.9 10*3/uL — ABNORMAL HIGH (ref 4.0–10.5)
nRBC: 0 % (ref 0.0–0.2)

## 2020-10-29 LAB — BASIC METABOLIC PANEL
Anion gap: 9 (ref 5–15)
BUN: 12 mg/dL (ref 6–20)
CO2: 22 mmol/L (ref 22–32)
Calcium: 9.3 mg/dL (ref 8.9–10.3)
Chloride: 104 mmol/L (ref 98–111)
Creatinine, Ser: 0.92 mg/dL (ref 0.44–1.00)
GFR, Estimated: >60 mL/min (ref >60)
Glucose, Bld: 108 mg/dL — ABNORMAL HIGH (ref 70–99)
Potassium: 4.1 mmol/L (ref 3.5–5.1)
Sodium: 135 mmol/L (ref 135–145)

## 2020-10-29 MED ORDER — MECLIZINE HCL 25 MG PO TABS: 25.0000 mg | ORAL_TABLET | Freq: Three times a day (TID) | ORAL | 0 refills | Status: DC | PRN

## 2020-10-29 NOTE — Discharge Instructions (Addendum)
Followup with your doctor as needed

## 2020-10-29 NOTE — ED Triage Notes (Signed)
Pt states she has been having heart palpitations for the past 3 weeks  Pt states she has been seeing her dr for her thyroid and her levels are on the low side and she has seen her OB/GYN and was started on low dose hormones  Pt states she has been up all night with the palpitations and nausea and dizziness

## 2020-10-29 NOTE — ED Notes (Signed)
Review D/C papers with pt, reviewed Rx with pt, pt states understanding, pt denies questions at this time. 

## 2020-10-29 NOTE — ED Provider Notes (Signed)
[facility] EMERGENCY DEPARTMENT Provider Note   CSN: [id number] Arrival date & time: 10/29/20  7425     History Chief Complaint  Patient presents with   Palpitations    Michele Dennis is a 44 y.o. female.  HPI Patient for 3 weeks has had issues of dizziness heart palpitations and sweatiness at times.  They will come and go.  States it was more severe last night.  Has seen her endocrinologist who said her thyroid was actually running a little slow.  Had her follow-up with OB/GYN for hormones due to perimenopausal symptoms.  Now on low-dose hormones.  Episodes of come on to feel nauseous.  States she will feel a little bit as if things are a little "wavy".  In between episodes symptoms resolved completely.  No headache.  No confusion.  No weight loss.  Will get warm and will sometimes feel like chills after.  No dysuria.  No cough.    Past Medical History:  Diagnosis Date   Allergy    Thyroid disease     There are no problems to display for this patient.   History reviewed. No pertinent surgical history.   OB History   No obstetric history on file.     Family History  Problem Relation Age of Onset   Thyroid disease Mother     Social History   Tobacco Use   Smoking status: Never Smoker   Smokeless tobacco: Never Used  Vaping Use   Vaping Use: Never used  Substance Use Topics   Alcohol use: No   Drug use: No    Home Medications Prior to Admission medications   Medication Sig Start Date End Date Taking? Authorizing Provider  cephALEXin (KEFLEX) 500 MG capsule Take 1 capsule (500 mg total) by mouth 4 (four) times daily. Patient not taking: Reported on 02/05/2018 06/09/16   Horton, Mayer Masker, MD  HYDROcodone-acetaminophen (NORCO/VICODIN) 5-325 MG tablet Take 1-2 tablets by mouth every 6 (six) hours as needed for moderate pain. 02/05/18   Cathren Laine, MD  ibuprofen (ADVIL,MOTRIN) 200 MG tablet Take 400-600 mg by mouth every 6 (six) hours as  needed for headache or moderate pain.    [provider]  loratadine-pseudoephedrine (CLARITIN-D 12-HOUR) 5-120 MG per tablet Take 1 tablet by mouth as needed for allergies.     [provider]  meclizine (ANTIVERT) 25 MG tablet Take 1 tablet (25 mg total) by mouth 3 (three) times daily as needed for dizziness. 10/29/20   Benjiman Core, MD  Multiple Vitamins-Minerals (EMERGEN-C IMMUNE PO) Take 1 packet by mouth daily.    [provider]  ondansetron (ZOFRAN ODT) 8 MG disintegrating tablet Take 1 tablet (8 mg total) by mouth every 8 (eight) hours as needed for nausea or vomiting. 02/05/18   Cathren Laine, MD  SYNTHROID 88 MCG tablet Take 88 mcg by mouth daily before breakfast.  12/19/13   [provider]    Allergies    Sulfa antibiotics  Review of Systems   Review of Systems  Constitutional: Negative for appetite change, fatigue, fever and unexpected weight change.  Respiratory: Negative for shortness of breath.   Cardiovascular: Positive for palpitations.  Gastrointestinal: Negative for abdominal pain.  Endocrine: Negative for polyuria.  Genitourinary: Negative for flank pain.  Musculoskeletal: Negative for back pain.  Skin: Negative for rash.  Neurological: Positive for dizziness and light-headedness. Negative for weakness.  Psychiatric/Behavioral: Negative for confusion.    Physical Exam Updated Vital Signs BP 120/81  Pulse 69    Temp 97.6 F (36.4 C) (Oral)    Resp 12    Ht 5\' 6"  (1.676 m)    Wt 61.2 kg    LMP 09/27/2020 (Exact Date)    SpO2 100%    BMI 21.79 kg/m   Physical Exam Vitals and nursing note reviewed.  HENT:     Head: Normocephalic and atraumatic.     Mouth/Throat:     Mouth: Mucous membranes are moist.  Cardiovascular:     Rate and Rhythm: Regular rhythm.  Pulmonary:     Effort: Pulmonary effort is normal.  Abdominal:     Tenderness: There is no abdominal tenderness.  Musculoskeletal:     Right lower leg: No edema.      Left lower leg: No edema.  Skin:    General: Skin is warm.     Capillary Refill: Capillary refill takes less than 2 seconds.  Neurological:     General: No focal deficit present.     Mental Status: She is alert and oriented to person, place, and time.     Comments: Awake and appropriate normal speech.  Eye movements intact without nystagmus.  Finger-nose intact bilaterally.  Psychiatric:        Mood and Affect: Mood normal.     ED Results / Procedures / Treatments   Labs (all labs ordered are listed, but only abnormal results are displayed) Labs Reviewed  BASIC METABOLIC PANEL - Abnormal; Notable for the following components:      Result Value   Glucose, Bld 108 (*)    All other components within normal limits  CBC - Abnormal; Notable for the following components:   WBC 10.9 (*)    MCV 102.2 (*)    MCH 35.5 (*)    RDW 11.4 (*)    All other components within normal limits    EKG EKG Interpretation  Date/Time:  Tuesday October 29 2020 06:47:38 EST Ventricular Rate:  82 PR Interval:    QRS Duration: 94 QT Interval:  382 QTC Calculation: 447 R Axis:   59 Text Interpretation: Sinus rhythm Confirmed by 08-13-1974 [phone]) on 10/29/2020 7:07:19 AM   Radiology No results found.  Procedures Procedures (including critical care time)  Medications Ordered in ED Medications - No data to display  ED Course  I have reviewed the triage vital signs and the nursing notes.  Pertinent labs & imaging results that were available during my care of the patient were reviewed by me and considered in my medical decision making (see chart for details).    MDM Rules/Calculators/A&P                          Patient with episodes of feeling dizziness and heart palpitations.  States feels if things move around a little bit.  Work-up reassuring.  Has had TSH reportedly done recently also.  Nonfocal exam at this time.  Potentially could be peripheral vertigo and will give a trial  of Antivert at home.  No arrhythmia while in the ER although potentially some arrhythmia could be occurring and making her feel bad.  Potentially also could be component of hot flashes doing with her perimenopausal symptoms.  Either way I think she is stable for discharge home.  Outpatient follow-up as needed.  I have reviewed blood work EKG and past history. Final Clinical Impression(s) / ED Diagnoses Final diagnoses:  Palpitations  Dizziness    Rx / DC Orders  ED Discharge Orders         Ordered    meclizine (ANTIVERT) 25 MG tablet  3 times daily PRN        10/29/20 4709           Benjiman Core, MD 10/29/20 1420

## 2021-02-20 ENCOUNTER — Other Ambulatory Visit: Payer: Self-pay

## 2021-02-20 ENCOUNTER — Encounter: Payer: Self-pay | Admitting: Family Medicine

## 2021-02-20 ENCOUNTER — Ambulatory Visit (INDEPENDENT_AMBULATORY_CARE_PROVIDER_SITE_OTHER): Payer: 59 | Admitting: Family Medicine

## 2021-02-20 DIAGNOSIS — M25522 Pain in left elbow: Secondary | ICD-10-CM | POA: Diagnosis not present

## 2021-02-20 MED ORDER — DICLOFENAC SODIUM 1 % EX GEL: 4.0000 g | Freq: Four times a day (QID) | CUTANEOUS | 6 refills | Status: AC | PRN

## 2021-02-20 NOTE — Progress Notes (Signed)
   Office Visit Note   Patient: Michele Dennis           Date of Birth: 22-Aug-1976           MRN: [id number] Visit Date: 02/20/2021 Requested by: No referring provider defined for this encounter. PCP: Patient, No Pcp Per  Subjective: Chief Complaint  Patient presents with  . Left Elbow - Pain    Had first noticed pain in the lateral elbow after working out 4 months. Did not fully go away, but has not been as noticeable, unless she did certain exercises with the left arm. Then 2 weeks ago started feeling numbness in the forearm and into some of the fingers. Right-hand dominant.     HPI: She is here with left elbow pain.  She is right-hand dominant.  Symptoms started about 4 months ago while working out, no definite moment of injury but a gradual onset of lateral elbow pain.  She started modifying her activities and her pain improved a little bit but never went away.  About 2 weeks ago she started feeling intermittent numbness and tingling in her forearm and into the fifth finger.  She has not noticed any weakness.  Denies any neck pain associated with this.  She has not taken any medications for this.                ROS:   All other systems were reviewed and are negative.  Objective: Vital Signs: There were no vitals taken for this visit.  Physical Exam:  General:  Alert and oriented, in no acute distress. Pulm:  Breathing unlabored. Psy:  Normal mood, congruent affect. Skin: No rash Left elbow: She has full neck range of motion with negative Spurling's test.  Upper extremity strength and reflexes are normal.  Left elbow is point tender at the common extensor tendon at the lateral epicondyle.  There is minimal discomfort at the radial tunnel.  She has pain with wrist extension and forearm pronation/supination against resistance.    Imaging: No results found.  Assessment & Plan: 1.  Left elbow pain suspect due to lateral epicondylitis.  She may have radial tunnel syndrome as  well. -We discussed options and elected to try a tennis elbow strap, Voltaren gel, and physical therapy at [facility].  Could consider injection if symptoms persist.     Procedures: No procedures performed        PMFS History: There are no problems to display for this patient.  Past Medical History:  Diagnosis Date  . Allergy   . Thyroid disease     Family History  Problem Relation Age of Onset  . Thyroid disease Mother     History reviewed. No pertinent surgical history. Social History   Occupational History  . Not on file  Tobacco Use  . Smoking status: Never Smoker  . Smokeless tobacco: Never Used  Vaping Use  . Vaping Use: Never used  Substance and Sexual Activity  . Alcohol use: No  . Drug use: No  . Sexual activity: Yes    Birth control/protection: Pill

## 2021-04-16 NOTE — Progress Notes (Signed)
Cardiology Office Note:    Date:  04/17/2021   ID:  Michele Dennis, DOB [birth year], MRN [id number]  PCP:  Patient, No Pcp Per (Inactive)   [facility] Providers Cardiologist:  None     Referring MD: Maurice Small, MD   Chief Complaint  Patient presents with  . Palpitations         Apr 17, 2021   Michele Dennis is a 45 y.o. female with a hx of  Palpitations We were asked to see her for further evaluation of these palpitations by Dr. Valentina Lucks Wife of Michele Dennis. Has had palpitations  Off and on since November. Will occur for several weeks and then go away  Also has some dizziness ( not related to the palpitations)  Single HR irregularlity,  Then back to normal  Is going through menapause . Is on a new hormone patch now  Takes clariten D  Twice a day  Is on synthroid   Uses electrolyte replacement   Is a runner - 3-4 miles , 3 times a week .   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      Past Medical History:  Diagnosis Date  . Allergy   . Anxiety   . History of 2019 novel coronavirus disease (COVID-19)   . Hypothyroidism   . Menopausal and perimenopausal disorder   . Migraine   . Thyroid disease   . Vitamin D deficiency     History reviewed. No pertinent surgical history.  Current Medications: Current Meds  Medication Sig  . ALPRAZolam (XANAX) 0.5 MG tablet Take 0.5 mg by mouth at bedtime as needed for anxiety.  . B Complex Vitamins (VITAMIN B COMPLEX PO) Take by mouth daily in the afternoon.  . Cholecalciferol (VITAMIN D3) 1.25 MG (50000 UT) CAPS Take by mouth once a week.  . diclofenac Sodium (VOLTAREN) 1 % GEL Apply 4 g topically 4 (four) times daily as needed.  Marland Kitchen estradiol-norethindrone (COMBIPATCH) 0.05-0.14 MG/DAY 1 patch to skin  . ESTRADIOL-PROGESTERONE PO Take by mouth daily.  . fluticasone (FLONASE) 50 MCG/ACT nasal spray Place 1 spray into both  nostrils daily.  Marland Kitchen ibuprofen (ADVIL,MOTRIN) 200 MG tablet Take 400-600 mg by mouth every 6 (six) hours as needed for headache or moderate pain.  Marland Kitchen loratadine-pseudoephedrine (CLARITIN-D 12-HOUR) 5-120 MG per tablet Take 1 tablet by mouth as needed for allergies.   Marland Kitchen SYNTHROID 88 MCG tablet Take 88 mcg by mouth daily before breakfast.   . Vitamin D, Ergocalciferol, (DRISDOL) 1.25 MG (50000 UNIT) CAPS capsule Take 50,000 Units by mouth every 7 (seven) days.     Allergies:   Sulfa antibiotics   Social History   Socioeconomic History  . Marital status: Single    Spouse name: Not on file  . Number of children: Not on file  . Years of education: Not on file  . Highest education level: Not on file  Occupational History  . Not on file  Tobacco Use  . Smoking status: Never Smoker  . Smokeless tobacco: Never Used  Vaping Use  . Vaping Use: Never used  Substance and Sexual Activity  . Alcohol use: No  . Drug use: No  . Sexual activity: Yes    Birth control/protection: Pill  Other Topics Concern  . Not on file  Social History Narrative  . Not on file   Social Determinants of Health   Financial Resource  Strain: Not on file  Food Insecurity: Not on file  Transportation Needs: Not on file  Physical Activity: Not on file  Stress: Not on file  Social Connections: Not on file     Family History: The patient's family history includes CVA in her maternal grandfather; Hypercholesterolemia in her maternal grandfather; Lung cancer in her paternal grandfather and paternal grandmother; Thyroid disease in her mother.  ROS:   Please see the history of present illness.     All other systems reviewed and are negative.  EKGs/Labs/Other Studies Reviewed:    The following studies were reviewed today:   EKG  Apr 17, 2021: NSR at 87 , no ST or T  Wave change.  Recent Labs: 10/29/2020: BUN 12; Creatinine, Ser 0.92; Hemoglobin 14.5; Platelets 321; Potassium 4.1; Sodium 135  Recent Lipid  Panel No results found for: CHOL, TRIG, HDL, CHOLHDL, VLDL, LDLCALC, LDLDIRECT   Risk Assessment/Calculations:       Physical Exam:    VS:  BP 130/82 (BP Location: Right Arm, Patient Position: Sitting, Cuff Size: Normal)   Pulse 87   Ht 5\' 5"  (1.651 m)   Wt 134 lb (60.8 kg)   SpO2 99%   BMI 22.30 kg/m     Wt Readings from Last 3 Encounters:  04/17/21 134 lb (60.8 kg)  10/29/20 135 lb (61.2 kg)  02/05/18 127 lb (57.6 kg)     GEN:  Well nourished, well developed in no acute distress HEENT: Normal NECK: No JVD; No carotid bruits LYMPHATICS: No lymphadenopathy CARDIAC: RRR, no murmurs, rubs, gallops RESPIRATORY:  Clear to auscultation without rales, wheezing or rhonchi  ABDOMEN: Soft, non-tender, non-distended MUSCULOSKELETAL:  No edema; No deformity  SKIN: Warm and dry NEUROLOGIC:  Alert and oriented x 3 PSYCHIATRIC:  Normal affect   ASSESSMENT:    1. Palpitations    PLAN:      1. Palpitations: 02/07/18 presents for further evaluation of palpitations.  These have been going on since November.  She has had a lot of potential causes for these palpitations.  She is reached menopause recently.  She is also been taking Claritin-D.  She is also o of asked thyroid replacement.  To change her Claritin-D to plain Claritin.  She will continue with her current hormone replacement.  I have asked her to hydrate better - Add V-8 once a day     Medication Adjustments/Labs and Tests Ordered: Current medicines are reviewed at length with the patient today.  Concerns regarding medicines are outlined above.  Orders Placed This Encounter  Procedures  . EKG 12-Lead   No orders of the defined types were placed in this encounter.   Patient Instructions  Medication Instructions:  Your physician recommends that you continue on your current medications as directed. Please refer to the Current Medication list given to you today.  *If you need a refill on your cardiac medications before  your next appointment, please call your pharmacy*   Lab Work: none If you have labs (blood work) drawn today and your tests are completely normal, you will receive your results only by: December MyChart Message (if you have MyChart) OR . A paper copy in the mail If you have any lab test that is abnormal or we need to change your treatment, we will call you to review the results.   Testing/Procedures: none   Follow-Up: At [facility], you and your health needs are our priority.  As part of our continuing mission to provide you with exceptional  heart care, we have created designated Provider Care Teams.  These Care Teams include your primary Cardiologist (physician) and Advanced Practice Providers (APPs -  Physician Assistants and Nurse Practitioners) who all work together to provide you with the care you need, when you need it.  We recommend signing up for the patient portal called "MyChart".  Sign up information is provided on this After Visit Summary.  MyChart is used to connect with patients for Virtual Visits (Telemedicine).  Patients are able to view lab/test results, encounter notes, upcoming appointments, etc.  Non-urgent messages can be sent to your provider as well.   To learn more about what you can do with MyChart, go to [email or web address].    Your next appointment:   6 month(s)  The format for your next appointment:   In Person  Provider:   You may see Dr. Elease Hashimoto or one of the following Advanced Practice Providers on your designated Care Team:    Tereso Newcomer, PA-C  [place] Aus, [place]       Signed, Kristeen Miss, MD  04/17/2021 4:25 PM    [facility]

## 2021-04-17 ENCOUNTER — Other Ambulatory Visit: Payer: Self-pay

## 2021-04-17 ENCOUNTER — Ambulatory Visit (INDEPENDENT_AMBULATORY_CARE_PROVIDER_SITE_OTHER): Payer: 59 | Admitting: Cardiovascular Disease

## 2021-04-17 VITALS — BP 130/82 | HR 87 | Ht 65.0 in | Wt 134.0 lb

## 2021-04-17 DIAGNOSIS — R002 Palpitations: Secondary | ICD-10-CM | POA: Diagnosis not present

## 2021-04-17 NOTE — Patient Instructions (Signed)
Medication Instructions:  Your physician recommends that you continue on your current medications as directed. Please refer to the Current Medication list given to you today.  *If you need a refill on your cardiac medications before your next appointment, please call your pharmacy*   Lab Work: none If you have labs (blood work) drawn today and your tests are completely normal, you will receive your results only by: Marland Kitchen MyChart Message (if you have MyChart) OR . A paper copy in the mail If you have any lab test that is abnormal or we need to change your treatment, we will call you to review the results.   Testing/Procedures: none   Follow-Up: At [facility], you and your health needs are our priority.  As part of our continuing mission to provide you with exceptional heart care, we have created designated Provider Care Teams.  These Care Teams include your primary Cardiologist (physician) and Advanced Practice Providers (APPs -  Physician Assistants and Nurse Practitioners) who all work together to provide you with the care you need, when you need it.  We recommend signing up for the patient portal called "MyChart".  Sign up information is provided on this After Visit Summary.  MyChart is used to connect with patients for Virtual Visits (Telemedicine).  Patients are able to view lab/test results, encounter notes, upcoming appointments, etc.  Non-urgent messages can be sent to your provider as well.   To learn more about what you can do with MyChart, go to [email or web address].    Your next appointment:   6 month(s)  The format for your next appointment:   In Person  Provider:   You may see Dr. Elease Hashimoto or one of the following Advanced Practice Providers on your designated Care Team:    Tereso Newcomer, PA-C  [place], [place]

## 2022-04-03 DIAGNOSIS — E039 Hypothyroidism, unspecified: Secondary | ICD-10-CM | POA: Diagnosis not present

## 2022-04-10 DIAGNOSIS — E039 Hypothyroidism, unspecified: Secondary | ICD-10-CM | POA: Diagnosis not present

## 2022-04-13 ENCOUNTER — Ambulatory Visit: Payer: Self-pay | Admitting: Cardiovascular Disease

## 2022-06-25 DIAGNOSIS — L700 Acne vulgaris: Secondary | ICD-10-CM | POA: Diagnosis not present

## 2022-07-16 DIAGNOSIS — Z1231 Encounter for screening mammogram for malignant neoplasm of breast: Secondary | ICD-10-CM | POA: Diagnosis not present

## 2022-07-16 DIAGNOSIS — Z01419 Encounter for gynecological examination (general) (routine) without abnormal findings: Secondary | ICD-10-CM | POA: Diagnosis not present

## 2022-07-16 DIAGNOSIS — Z6823 Body mass index (BMI) 23.0-23.9, adult: Secondary | ICD-10-CM | POA: Diagnosis not present

## 2023-04-13 DIAGNOSIS — E039 Hypothyroidism, unspecified: Secondary | ICD-10-CM | POA: Diagnosis not present

## 2023-04-26 DIAGNOSIS — E039 Hypothyroidism, unspecified: Secondary | ICD-10-CM | POA: Diagnosis not present

## 2023-07-21 DIAGNOSIS — Z87440 Personal history of urinary (tract) infections: Secondary | ICD-10-CM | POA: Diagnosis not present

## 2023-07-21 DIAGNOSIS — R300 Dysuria: Secondary | ICD-10-CM | POA: Diagnosis not present

## 2023-08-26 DIAGNOSIS — L570 Actinic keratosis: Secondary | ICD-10-CM | POA: Diagnosis not present

## 2023-08-26 DIAGNOSIS — L738 Other specified follicular disorders: Secondary | ICD-10-CM | POA: Diagnosis not present

## 2023-08-26 DIAGNOSIS — L821 Other seborrheic keratosis: Secondary | ICD-10-CM | POA: Diagnosis not present

## 2023-10-02 DIAGNOSIS — R300 Dysuria: Secondary | ICD-10-CM | POA: Diagnosis not present

## 2023-10-02 DIAGNOSIS — R350 Frequency of micturition: Secondary | ICD-10-CM | POA: Diagnosis not present

## 2023-10-08 DIAGNOSIS — Z1331 Encounter for screening for depression: Secondary | ICD-10-CM | POA: Diagnosis not present

## 2023-10-08 DIAGNOSIS — Z01419 Encounter for gynecological examination (general) (routine) without abnormal findings: Secondary | ICD-10-CM | POA: Diagnosis not present

## 2023-10-08 DIAGNOSIS — Z1231 Encounter for screening mammogram for malignant neoplasm of breast: Secondary | ICD-10-CM | POA: Diagnosis not present

## 2023-10-08 DIAGNOSIS — Z124 Encounter for screening for malignant neoplasm of cervix: Secondary | ICD-10-CM | POA: Diagnosis not present

## 2023-10-11 DIAGNOSIS — R399 Unspecified symptoms and signs involving the genitourinary system: Secondary | ICD-10-CM | POA: Diagnosis not present

## 2023-10-13 DIAGNOSIS — N390 Urinary tract infection, site not specified: Secondary | ICD-10-CM | POA: Diagnosis not present

## 2023-10-18 DIAGNOSIS — N898 Other specified noninflammatory disorders of vagina: Secondary | ICD-10-CM | POA: Diagnosis not present

## 2023-10-18 DIAGNOSIS — B3731 Acute candidiasis of vulva and vagina: Secondary | ICD-10-CM | POA: Diagnosis not present

## 2023-10-24 DIAGNOSIS — N760 Acute vaginitis: Secondary | ICD-10-CM | POA: Diagnosis not present

## 2023-10-26 DIAGNOSIS — N760 Acute vaginitis: Secondary | ICD-10-CM | POA: Diagnosis not present

## 2023-10-26 DIAGNOSIS — B3731 Acute candidiasis of vulva and vagina: Secondary | ICD-10-CM | POA: Diagnosis not present

## 2023-11-02 DIAGNOSIS — N760 Acute vaginitis: Secondary | ICD-10-CM | POA: Diagnosis not present

## 2023-11-02 DIAGNOSIS — L293 Anogenital pruritus, unspecified: Secondary | ICD-10-CM | POA: Diagnosis not present

## 2023-11-02 DIAGNOSIS — N898 Other specified noninflammatory disorders of vagina: Secondary | ICD-10-CM | POA: Diagnosis not present

## 2023-11-16 DIAGNOSIS — N761 Subacute and chronic vaginitis: Secondary | ICD-10-CM | POA: Diagnosis not present

## 2023-12-29 DIAGNOSIS — Z0000 Encounter for general adult medical examination without abnormal findings: Secondary | ICD-10-CM | POA: Diagnosis not present

## 2023-12-29 DIAGNOSIS — E559 Vitamin D deficiency, unspecified: Secondary | ICD-10-CM | POA: Diagnosis not present

## 2023-12-29 DIAGNOSIS — E7800 Pure hypercholesterolemia, unspecified: Secondary | ICD-10-CM | POA: Diagnosis not present

## 2024-04-25 DIAGNOSIS — E039 Hypothyroidism, unspecified: Secondary | ICD-10-CM | POA: Diagnosis not present

## 2024-05-02 DIAGNOSIS — E039 Hypothyroidism, unspecified: Secondary | ICD-10-CM | POA: Diagnosis not present

## 2024-05-04 DIAGNOSIS — N952 Postmenopausal atrophic vaginitis: Secondary | ICD-10-CM | POA: Diagnosis not present

## 2024-10-27 DIAGNOSIS — N951 Menopausal and female climacteric states: Secondary | ICD-10-CM | POA: Diagnosis not present

## 2024-10-27 DIAGNOSIS — Z01419 Encounter for gynecological examination (general) (routine) without abnormal findings: Secondary | ICD-10-CM | POA: Diagnosis not present

## 2024-10-27 DIAGNOSIS — Z1231 Encounter for screening mammogram for malignant neoplasm of breast: Secondary | ICD-10-CM | POA: Diagnosis not present

## 2024-10-27 DIAGNOSIS — Z1331 Encounter for screening for depression: Secondary | ICD-10-CM | POA: Diagnosis not present

## 2024-10-27 DIAGNOSIS — Z01411 Encounter for gynecological examination (general) (routine) with abnormal findings: Secondary | ICD-10-CM | POA: Diagnosis not present
